# Patient Record
Sex: Male | Born: 1970 | Race: White | Hispanic: No | Marital: Married | Smoking: Never smoker
Health system: Southern US, Community
[De-identification: ages and names within clinical notes are randomized; demographics above are authoritative.]

## PROBLEM LIST (undated history)

## (undated) DIAGNOSIS — F32A Depression, unspecified: Secondary | ICD-10-CM

## (undated) DIAGNOSIS — N200 Calculus of kidney: Secondary | ICD-10-CM

## (undated) DIAGNOSIS — F419 Anxiety disorder, unspecified: Secondary | ICD-10-CM

## (undated) DIAGNOSIS — G4700 Insomnia, unspecified: Secondary | ICD-10-CM

## (undated) DIAGNOSIS — I10 Essential (primary) hypertension: Secondary | ICD-10-CM

## (undated) DIAGNOSIS — F329 Major depressive disorder, single episode, unspecified: Secondary | ICD-10-CM

## (undated) HISTORY — PX: CERVICAL FUSION: SHX112

## (undated) HISTORY — PX: ROTATOR CUFF REPAIR: SHX139

## (undated) HISTORY — PX: BARIATRIC SURGERY: SHX1103

## (undated) HISTORY — PX: KNEE SURGERY: SHX244

---
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
## 2011-04-10 ENCOUNTER — Encounter: Payer: Self-pay | Admitting: *Deleted

## 2011-04-10 ENCOUNTER — Emergency Department (INDEPENDENT_AMBULATORY_CARE_PROVIDER_SITE_OTHER): Payer: BC Managed Care – PPO

## 2011-04-10 ENCOUNTER — Emergency Department (INDEPENDENT_AMBULATORY_CARE_PROVIDER_SITE_OTHER)
Admission: EM | Admit: 2011-04-10 | Discharge: 2011-04-10 | Disposition: A | Discharge status: Home / Self Care | Payer: BC Managed Care – PPO | Source: Home / Self Care

## 2011-04-10 DIAGNOSIS — S96919A Strain of unspecified muscle and tendon at ankle and foot level, unspecified foot, initial encounter: Secondary | ICD-10-CM

## 2011-04-10 DIAGNOSIS — S93409A Sprain of unspecified ligament of unspecified ankle, initial encounter: Secondary | ICD-10-CM

## 2011-04-10 HISTORY — DX: Essential (primary) hypertension: I10

## 2011-04-10 HISTORY — DX: Calculus of kidney: N20.0

## 2011-04-10 MED ORDER — IBUPROFEN 800 MG PO TABS: 800.0000 mg | ORAL_TABLET | Freq: Three times a day (TID) | ORAL | Status: AC

## 2011-04-10 NOTE — ED Notes (Signed)
pT  REPORTS  HE  TURNED  HIS  L  ANKLE  SEV  HOURS  AGO  WHILE  AMBULATING  HE  REPORTS  PAIN ./ SWELLING  EVIDENT   NO  OBVIOUS  DEFORMITY  NOTED    HE  IS  ABLE  TO  BEAR  WEIGHT  BUT HAS  PAIN

## 2011-04-10 NOTE — ED Provider Notes (Signed)
History     CSN: [id number]  Arrival date & time 04/10/11  1722   None     Chief Complaint  Patient presents with  . Ankle Injury    (Consider location/radiation/quality/duration/timing/severity/associated sxs/prior treatment) HPI Comments: Pt states he caught his foot on the kitchen table leg at dinner this evening and rolled in ankle inward. He c/o medial ankle pain. Feels better "when my foot is flat on the floor" and hurts more "when I pick it up." He has been applying ice but has not taken anything for discomfort.   The history is provided by the patient.    Past Medical History  Diagnosis Date  . Hypertension   . Kidney stones     Past Surgical History  Procedure Date  . Knee surgery   . Cervical fusion     History reviewed. No pertinent family history.  History  Substance Use Topics  . Smoking status: Not on file  . Smokeless tobacco: Not on file  . Alcohol Use:       Review of Systems  Cardiovascular: Negative for leg swelling.  Musculoskeletal: Negative for joint swelling.  Skin: Negative for color change, rash and wound.    Allergies  Review of patient's allergies indicates no known allergies.  Home Medications   Current Outpatient Rx  Name Route Sig Dispense Refill  . ALPRAZOLAM 0.5 MG PO TABS Oral Take 0.5 mg by mouth at bedtime as needed.      . DULOXETINE HCL 30 MG PO CPEP Oral Take 30 mg by mouth daily.      Marland Kitchen LISINOPRIL 10 MG PO TABS Oral Take 10 mg by mouth daily.      Marland Kitchen ZOLPIDEM TARTRATE 10 MG PO TABS Oral Take 10 mg by mouth at bedtime as needed.      . IBUPROFEN 800 MG PO TABS Oral Take 1 tablet (800 mg total) by mouth 3 (three) times daily. 15 tablet 0    BP 155/109  Pulse 110  Temp(Src) 98.2 F (36.8 C) (Oral)  Resp 23  SpO2 97%  Physical Exam  Nursing note and vitals reviewed. Constitutional: He appears well-developed and well-nourished. No distress.  HENT:  Head: Normocephalic and atraumatic.  Musculoskeletal:   Left ankle: He exhibits normal range of motion, no swelling, no ecchymosis, no deformity, no laceration and normal pulse. tenderness (soft tissue tenderness posterior to distal tibia). No lateral malleolus, no medial malleolus, no AITFL, no CF ligament, no posterior TFL, no head of 5th metatarsal and no proximal fibula tenderness found. Achilles tendon normal.       Feet:  Neurological: He is alert. He has normal strength. No sensory deficit. Abnormal gait: limping.  Skin: Skin is warm and dry. No erythema.  Psychiatric: He has a normal mood and affect.    ED Course  Procedures (including critical care time)  Labs Reviewed - No data to display Dg Ankle Complete Left  04/10/2011  *RADIOLOGY REPORT*  Clinical Data: Inversion injury of the ankle.  Lateral and posterior pain.  LEFT ANKLE COMPLETE - 3+ VIEW  Comparison: None.  Findings: The mortise appears intact.  No malleolar fracture is observed.  However, there is equivocal periosteal reaction along the distal fibular metaphysis, and if there is a high index of suspicion of occult injury based on point tenderness, CT or MRI can be utilized for further characterization.  The talar dome appears unremarkable.  No fracture of the base of the fifth metatarsal is observed.  No calcaneal  fracture is noted.  IMPRESSION:  1.  No fracture is observed.  There is faint periosteal reaction along the distal fibular metaphysis, and accordingly if there is a high clinical index of suspicion of a fracture of the distal fibular metaphysis based on point tenderness then CT or MRI might be utilized to exclude occult fracture.  Original Report Authenticated By: Dellia Cloud, M.D.     1. Ankle strain       MDM   Medial Lt ankle pain. Ankle xray reviewed. Nontender distal fibula on exam and no c/o lateral ankle pain.        Melody Comas, [place] 04/10/11 2043

## 2011-04-14 NOTE — ED Provider Notes (Signed)
Medical screening examination/treatment/procedure(s) were performed by non-physician practitioner and as supervising physician I was immediately available for consultation/collaboration.  Luiz Blare MD   Luiz Blare, MD 04/14/11 [phone]

## 2019-08-06 ENCOUNTER — Other Ambulatory Visit: Payer: Self-pay

## 2019-08-06 ENCOUNTER — Encounter (HOSPITAL_COMMUNITY): Payer: Self-pay | Admitting: Emergency Medicine

## 2019-08-06 DIAGNOSIS — I10 Essential (primary) hypertension: Secondary | ICD-10-CM | POA: Insufficient documentation

## 2019-08-06 DIAGNOSIS — R110 Nausea: Secondary | ICD-10-CM | POA: Diagnosis not present

## 2019-08-06 DIAGNOSIS — R1031 Right lower quadrant pain: Secondary | ICD-10-CM | POA: Diagnosis present

## 2019-08-06 DIAGNOSIS — Z79899 Other long term (current) drug therapy: Secondary | ICD-10-CM | POA: Diagnosis not present

## 2019-08-06 DIAGNOSIS — E860 Dehydration: Secondary | ICD-10-CM | POA: Diagnosis not present

## 2019-08-06 LAB — URINALYSIS, ROUTINE W REFLEX MICROSCOPIC
Bilirubin Urine: NEGATIVE
Glucose, UA: NEGATIVE mg/dL
Hgb urine dipstick: NEGATIVE
Ketones, ur: NEGATIVE mg/dL
Leukocytes,Ua: NEGATIVE
Nitrite: NEGATIVE
Protein, ur: NEGATIVE mg/dL
Specific Gravity, Urine: 1.016 (ref 1.005–1.030)
pH: 6 (ref 5.0–8.0)

## 2019-08-06 LAB — COMPREHENSIVE METABOLIC PANEL
ALT: 41 U/L (ref 0–44)
AST: 19 U/L (ref 15–41)
Albumin: 4.1 g/dL (ref 3.5–5.0)
Alkaline Phosphatase: 116 U/L (ref 38–126)
Anion gap: 7 (ref 5–15)
BUN: 15 mg/dL (ref 6–20)
CO2: 17 mmol/L — ABNORMAL LOW (ref 22–32)
Calcium: 8.1 mg/dL — ABNORMAL LOW (ref 8.9–10.3)
Chloride: 115 mmol/L — ABNORMAL HIGH (ref 98–111)
Creatinine, Ser: 0.84 mg/dL (ref 0.61–1.24)
GFR calc Af Amer: >60 mL/min (ref >60)
GFR calc non Af Amer: >60 mL/min (ref >60)
Glucose, Bld: 99 mg/dL (ref 70–99)
Potassium: 3.5 mmol/L (ref 3.5–5.1)
Sodium: 139 mmol/L (ref 135–145)
Total Bilirubin: 0.4 mg/dL (ref 0.3–1.2)
Total Protein: 6.6 g/dL (ref 6.5–8.1)

## 2019-08-06 LAB — CBC
HCT: 41.8 % (ref 39.0–52.0)
Hemoglobin: 13.5 g/dL (ref 13.0–17.0)
MCH: 30.6 pg (ref 26.0–34.0)
MCHC: 32.3 g/dL (ref 30.0–36.0)
MCV: 94.8 fL (ref 80.0–100.0)
Platelets: 161 10*3/uL (ref 150–400)
RBC: 4.41 MIL/uL (ref 4.22–5.81)
RDW: 13.7 % (ref 11.5–15.5)
WBC: 5.3 10*3/uL (ref 4.0–10.5)
nRBC: 0 % (ref 0.0–0.2)

## 2019-08-06 LAB — LIPASE, BLOOD: Lipase: 31 U/L (ref 11–51)

## 2019-08-06 NOTE — ED Triage Notes (Signed)
Pt c/o RLQ abd pain that started today at 0700. States it has gotten progressively worse sine this AM.

## 2019-08-07 ENCOUNTER — Emergency Department (HOSPITAL_COMMUNITY): Payer: BC Managed Care – PPO

## 2019-08-07 ENCOUNTER — Emergency Department (HOSPITAL_COMMUNITY)
Admission: EM | Admit: 2019-08-07 | Discharge: 2019-08-07 | Disposition: A | Discharge status: Home / Self Care | Payer: BC Managed Care – PPO | Attending: Emergency Medicine | Admitting: Emergency Medicine

## 2019-08-07 DIAGNOSIS — R1031 Right lower quadrant pain: Secondary | ICD-10-CM

## 2019-08-07 DIAGNOSIS — E860 Dehydration: Secondary | ICD-10-CM

## 2019-08-07 HISTORY — DX: Anxiety disorder, unspecified: F41.9

## 2019-08-07 HISTORY — DX: Insomnia, unspecified: G47.00

## 2019-08-07 HISTORY — DX: Depression, unspecified: F32.A

## 2019-08-07 MED ORDER — IOHEXOL 300 MG/ML  SOLN
100.0000 mL | Freq: Once | INTRAMUSCULAR | Status: AC | PRN
  Administered 2019-08-07: 02:00:00 100 mL via INTRAVENOUS

## 2019-08-07 MED ORDER — FENTANYL CITRATE (PF) 100 MCG/2ML IJ SOLN
100.0000 ug | Freq: Once | INTRAMUSCULAR | Status: AC
  Administered 2019-08-07: 01:00:00 100 ug via INTRAVENOUS
  Filled 2019-08-07: qty 2

## 2019-08-07 MED ORDER — SODIUM CHLORIDE 0.9 % IV BOLUS (SEPSIS)
2000.0000 mL | Freq: Once | INTRAVENOUS | Status: AC
  Administered 2019-08-07: 2000 mL via INTRAVENOUS

## 2019-08-07 MED ORDER — ONDANSETRON HCL 4 MG/2ML IJ SOLN
4.0000 mg | Freq: Once | INTRAMUSCULAR | Status: AC
  Administered 2019-08-07: 4 mg via INTRAVENOUS
  Filled 2019-08-07: qty 2

## 2019-08-07 NOTE — Discharge Instructions (Addendum)
°  SEEK IMMEDIATE MEDICAL ATTENTION IF: °The pain does not go away or becomes severe, particularly over the next 8-12 hours.  °A temperature above 100.4F develops.  °Repeated vomiting occurs (multiple episodes).  °The pain becomes localized to portions of the abdomen.  In an adult, the left lower portion of the abdomen could be colitis or diverticulitis.  °Blood is being passed in stools or vomit (bright red or black tarry stools).  °Return also if you develop chest pain, difficulty breathing, dizziness or fainting, or become confused, poorly responsive, or inconsolable. ° °

## 2019-08-07 NOTE — ED Provider Notes (Signed)
[facility] EMERGENCY DEPARTMENT Provider Note   CSN: [id number] Arrival date & time: 08/06/19  2106     History Chief Complaint  Patient presents with  . Abdominal Pain    Roberto Lawson is a 49 y.o. male.  The history is provided by the patient.  Abdominal Pain Pain location:  RLQ Pain quality: aching   Pain radiates to:  Does not radiate Pain severity:  Moderate Onset quality:  Gradual Duration:  18 hours Timing:  Constant Progression:  Worsening Chronicity:  New Relieved by:  Nothing Worsened by:  Movement and palpation Associated symptoms: nausea   Associated symptoms: no chest pain, no diarrhea, no dysuria, no fever, no shortness of breath and no vomiting    Patient reports he woke up with right lower quadrant abdominal pain that has been worsening throughout the day.  It does not radiate.  Has never had this before Previous history of bariatric surgery as well as cholecystectomy    Past Medical History:  Diagnosis Date  . Anxiety   . Depression   . Hypertension   . Insomnia   . Kidney stones     There are no problems to display for this patient.   Past Surgical History:  Procedure Laterality Date  . BARIATRIC SURGERY    . CERVICAL FUSION    . KNEE SURGERY    . ROTATOR CUFF REPAIR Right        No family history on file.  Social History   Tobacco Use  . Smoking status: Never Smoker  . Smokeless tobacco: Never Used  Substance Use Topics  . Alcohol use: Not Currently  . Drug use: Not Currently    Home Medications Prior to Admission medications   Medication Sig Start Date End Date Taking? Authorizing Provider  ALPRAZolam Prudy Feeler) 0.5 MG tablet Take 0.5 mg by mouth at bedtime as needed.      [provider]  DULoxetine (CYMBALTA) 30 MG capsule Take 30 mg by mouth daily.      [provider]  lisinopril (PRINIVIL,ZESTRIL) 10 MG tablet Take 10 mg by mouth daily.      [provider]  zolpidem (AMBIEN) 10 MG tablet  Take 10 mg by mouth at bedtime as needed.      [provider]    Allergies    Indomethacin, Statins, and Wellbutrin [bupropion]  Review of Systems   Review of Systems  Constitutional: Negative for fever.  Respiratory: Negative for shortness of breath.   Cardiovascular: Negative for chest pain.  Gastrointestinal: Positive for abdominal pain and nausea. Negative for diarrhea and vomiting.  Genitourinary: Negative for dysuria and testicular pain.  All other systems reviewed and are negative.   Physical Exam Updated Vital Signs BP (!) 147/92 (BP Location: Right Arm)   Pulse 73   Temp 98.2 F (36.8 C) (Oral)   Resp 20   Ht 1.88 m (6\' 2" )   Wt 104.3 kg   SpO2 100%   BMI 29.53 kg/m   Physical Exam CONSTITUTIONAL: Well developed/well nourished, uncomfortable appearing HEAD: Normocephalic/atraumatic EYES: EOMI/PERRL ENMT: Mucous membranes moist NECK: supple no meningeal signs SPINE/BACK:entire spine nontender CV: S1/S2 noted, no murmurs/rubs/gallops noted LUNGS: Lungs are clear to auscultation bilaterally, no apparent distress ABDOMEN: soft, moderate RLQ tenderness, no rebound or guarding, bowel sounds noted throughout abdomen GU:no cva tenderness NEURO: Pt is awake/alert/appropriate, moves all extremitiesx4.  No facial droop.  EXTREMITIES: pulses normal/equal, full ROM SKIN: warm, color normal PSYCH: no abnormalities of mood noted, alert  and oriented to situation  ED Results / Procedures / Treatments   Labs (all labs ordered are listed, but only abnormal results are displayed) Labs Reviewed  COMPREHENSIVE METABOLIC PANEL - Abnormal; Notable for the following components:      Result Value   Chloride 115 (*)    CO2 17 (*)    Calcium 8.1 (*)    All other components within normal limits  LIPASE, BLOOD  CBC  URINALYSIS, ROUTINE W REFLEX MICROSCOPIC    EKG None  Radiology CT ABDOMEN PELVIS W CONTRAST  Result Date: 08/07/2019 CLINICAL DATA:  Right lower  quadrant abdominal pain EXAM: CT ABDOMEN AND PELVIS WITH CONTRAST TECHNIQUE: Multidetector CT imaging of the abdomen and pelvis was performed using the standard protocol following bolus administration of intravenous contrast. CONTRAST:  OMNIPAQUE IOHEXOL 300 MG/ML  SOLN COMPARISON:  None. FINDINGS: Lower chest: The visualized heart size within normal limits. No pericardial fluid/thickening. No hiatal hernia. The visualized portions of the lungs are clear. Hepatobiliary: The liver is normal in density without focal abnormality.The main portal vein is patent. The patient is status post cholecystectomy. No biliary ductal dilation. Pancreas: Unremarkable. No pancreatic ductal dilatation or surrounding inflammatory changes. Spleen: Normal in size without focal abnormality. Adrenals/Urinary Tract: Both adrenal glands appear normal. There is a punctate calculus seen in the lower pole the right kidney. No hydronephrosis is seen. Bladder is unremarkable. Stomach/Bowel: The patient is status post sleeve gastrectomy. The small bowel and colon are normal in appearance. Surgical anastomosis seen within the right lower quadrant. The appendix is unremarkable. No focal bowel wall thickening or mesenteric fat stranding changes are seen. There is a moderate amount of colonic stool present. Vascular/Lymphatic: There is mild fat stranding changes seen within the mid mesentery with scattered small lymph nodes present. No significant vascular findings are present. Reproductive: The prostate is unremarkable. Other: Small left inguinal fat containing hernia is present. Musculoskeletal: No acute or significant osseous findings. 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 IMPRESSION: Nonspecific mild mid mesenteric fat stranding with scattered small lymph nodes which may be due to mild mesenteric panniculitis. Punctate nonobstructing right renal calculi. Electronically Signed    By: Jonna Clark M.D.   On: 08/07/2019 02:05    Procedures Procedures    Medications Ordered in ED Medications  sodium chloride 0.9 % bolus 2,000 mL (0 mLs Intravenous Stopped 08/07/19 0314)  fentaNYL (SUBLIMAZE) injection 100 mcg (100 mcg Intravenous Given 08/07/19 0127)  ondansetron (ZOFRAN) injection 4 mg (4 mg Intravenous Given 08/07/19 0127)  iohexol (OMNIPAQUE) 300 MG/ML solution 100 mL (100 mLs Intravenous Contrast Given 08/07/19 0137)    ED Course  I have reviewed the triage vital signs and the nursing notes.  Pertinent labs & imaging results that were available during my care of the patient were reviewed by me and considered in my medical decision making (see chart for details).    MDM Rules/Calculators/A&P                       This patient presents to the ED for concern of abdominal pain, this involves an extensive number of treatment options, and is a complaint that carries with it a high risk of complications and morbidity.  The differential diagnosis includes appendicitis, UTI, diverticulitis, bowel perforation   Lab Tests:   I Ordered, reviewed, and interpreted labs, which included urinalysis, electrolyte panel, complete blood count  Medicines  ordered:   I ordered medication fentanyl and Zofran for nausea and pain, IV fluids for dehydration  Imaging Studies ordered:   I ordered imaging studies which included CT abdomen pelvis   I independently visualized and interpreted imaging which showed mesenteric fat stranding, no appendicitis  Reevaluation:  After the interventions stated above, I reevaluated the patient and found patient is improved   Patient presented right lower quadrant abdominal pain.  No leukocytosis, no fever.  He was dehydrated and given IV fluids.  CT imaging reveals mesenteric fat stranding, but no acute abdominal emergency.  Patient feels improved.  Will discharge home. Final Clinical Impression(s) / ED Diagnoses Final diagnoses:  Right  lower quadrant abdominal pain  Dehydration    Rx / DC Orders ED Discharge Orders    None       Ripley Fraise, MD 08/07/19 [phone]

## 2020-02-23 ENCOUNTER — Other Ambulatory Visit: Payer: Self-pay

## 2020-02-23 ENCOUNTER — Encounter: Payer: Self-pay | Admitting: Orthopedic Surgery

## 2020-02-23 ENCOUNTER — Ambulatory Visit: Payer: BC Managed Care – PPO | Admitting: Orthopedic Surgery

## 2020-02-23 DIAGNOSIS — M25871 Other specified joint disorders, right ankle and foot: Secondary | ICD-10-CM

## 2020-02-23 DIAGNOSIS — M1A0710 Idiopathic chronic gout, right ankle and foot, without tophus (tophi): Secondary | ICD-10-CM | POA: Diagnosis not present

## 2020-02-23 DIAGNOSIS — M7671 Peroneal tendinitis, right leg: Secondary | ICD-10-CM | POA: Diagnosis not present

## 2020-02-23 NOTE — Progress Notes (Signed)
Office Visit Note   Patient: Roberto Lawson           Date of Birth: September 16, 1970           MRN: [id number] Visit Date: 02/23/2020              Requested by: Zenaida Niece, MD No address on file PCP: Zenaida Niece, MD  Chief Complaint  Patient presents with  . Right Ankle - Pain      HPI: Patient is a 49 year old gentleman who was seen for initial evaluation for right ankle pain.  He denies any specific injuries he feels like he may roll his ankle at work stepping on and off a 2 inch metal plate he states he has had symptoms for 2-1/2 months he has been treated in [place].  He has used an ankle stabilizing orthosis most recently has had an MRI scan which shows edema within the peroneal tendons.  Patient states he has a history of gout he is not on medication and states he has had no flareup.  He complains of pain anteriorly over the joint line as well as pain posterior medially and posterior laterally.  Assessment & Plan: Visit Diagnoses:  1. Idiopathic chronic gout of right ankle without tophus   2. Impingement of right ankle joint   3. Peroneal tendinitis, right leg     Plan: The ankle was injected from the anterior medial portal he tolerated this well we will obtain a uric acid in follow-up.  Discussed that we will see where his symptoms are primarily coming from at follow-up.  Follow-Up Instructions: Return in about 3 weeks (around 03/15/2020).   Ortho Exam  Patient is alert, oriented, no adenopathy, well-dressed, normal affect, normal respiratory effort. Examination patient does have venous swelling is difficult to palpate a pulse but he does have good capillary refill.  There is no redness no cellulitis he is primarily tender to palpation anteriorly over the joint line.  The peroneal and posterior tibial tendon are minimally tender to palpation he does have good ankle and subtalar motion but no increased laxity with varus or valgus stress this does not reproduce  symptoms.  Review the MRI scan does show some edema within the peroneal tendons but also shows some edema in the tibial talar joint as well as bony spurs anteriorly and posteriorly to the tibiotalar joint.  Do not appreciate any cystic changes.  Imaging: No results found. No images are attached to the encounter.  Labs: No results found for: HGBA1C, ESRSEDRATE, CRP, LABURIC, REPTSTATUS, GRAMSTAIN, CULT, LABORGA   Lab Results  Component Value Date   ALBUMIN 4.1 08/06/2019    No results found for: MG No results found for: VD25OH  No results found for: PREALBUMIN CBC EXTENDED Latest Ref Rng & Units 08/06/2019  WBC 4.0 - 10.5 K/uL 5.3  RBC 4.22 - 5.81 MIL/uL 4.41  HGB 13.0 - 17.0 g/dL 78.2  HCT 39 - 52 % 95.6  PLT 150 - 400 K/uL 161     There is no height or weight on file to calculate BMI.  Orders:  No orders of the defined types were placed in this encounter.  No orders of the defined types were placed in this encounter.    Procedures: No procedures performed  Clinical Data: No additional findings.  ROS:  All other systems negative, except as noted in the HPI. Review of Systems  Objective: Vital Signs: There were no vitals taken for this visit.  Specialty Comments:  No specialty comments available.  PMFS History: There are no problems to display for this patient.  Past Medical History:  Diagnosis Date  . Anxiety   . Depression   . Hypertension   . Insomnia   . Kidney stones     History reviewed. No pertinent family history.  Past Surgical History:  Procedure Laterality Date  . BARIATRIC SURGERY    . CERVICAL FUSION    . KNEE SURGERY    . ROTATOR CUFF REPAIR Right    Social History   Occupational History  . Not on file  Tobacco Use  . Smoking status: Never Smoker  . Smokeless tobacco: Never Used  Substance and Sexual Activity  . Alcohol use: Not Currently  . Drug use: Not Currently  . Sexual activity: Not on file

## 2020-02-24 LAB — URIC ACID: Uric Acid, Serum: 6.4 mg/dL (ref 4.0–8.0)

## 2020-03-17 ENCOUNTER — Ambulatory Visit: Payer: Self-pay

## 2020-03-17 ENCOUNTER — Ambulatory Visit (INDEPENDENT_AMBULATORY_CARE_PROVIDER_SITE_OTHER): Payer: BC Managed Care – PPO | Admitting: Orthopedic Surgery

## 2020-03-17 ENCOUNTER — Encounter: Payer: Self-pay | Admitting: Orthopedic Surgery

## 2020-03-17 DIAGNOSIS — M79671 Pain in right foot: Secondary | ICD-10-CM

## 2020-03-17 NOTE — Progress Notes (Signed)
Office Visit Note   Patient: Roberto Lawson           Date of Birth: 13-Apr-1970           MRN: [id number] Visit Date: 03/17/2020              Requested by: Zenaida Niece, MD No address on file PCP: Zenaida Niece, MD  Chief Complaint  Patient presents with  . Right Ankle - Follow-up      HPI: Patient is a 49 year old gentleman who presents in follow-up for his right ankl  patient underwent a steroid injection for the right ankle about 3 weeks ago he states that this has provided him good pain relief of the ankle.  Patient states that he felt a sharp pain Sunday at the base of the fifth metatarsal right foot patient feels like he heard a pop and has had difficulty with weightbearing.  Most recent uric acid is 6.4. Assessment & Plan: Visit Diagnoses:  1. Pain in right foot     Plan: Patient is given a postoperative shoe he may wear his steel toed shoes for work discussed that this is most likely a stress fracture and should resolve uneventfully.  Repeat three-view radiographs of the right foot at follow-up.  Follow-Up Instructions: Return in about 4 weeks (around 04/14/2020).   Ortho Exam  Patient is alert, oriented, no adenopathy, well-dressed, normal affect, normal respiratory effort. Examination patient has a good pulse he does have venous stasis swelling recommended compression stockings he has no pain with ankle or subtalar motion he is point tender to palpation over the base of the fifth metatarsal there is no ecchymosis no bruising.  Clinically patient has a stress fracture through the base of the fifth metatarsal.  Imaging: XR Foot Complete Right  Result Date: 03/17/2020 2 view radiographs of the right foot shows no displaced fracture of the fifth metatarsal radiographs are normal  No images are attached to the encounter.  Labs: Lab Results  Component Value Date   LABURIC 6.4 02/23/2020     Lab Results  Component Value Date   ALBUMIN 4.1 08/06/2019   LABURIC  6.4 02/23/2020    No results found for: MG No results found for: VD25OH  No results found for: PREALBUMIN CBC EXTENDED Latest Ref Rng & Units 08/06/2019  WBC 4.0 - 10.5 K/uL 5.3  RBC 4.22 - 5.81 MIL/uL 4.41  HGB 13.0 - 17.0 g/dL 01.0  HCT 93.2 - 35.5 % 41.8  PLT 150 - 400 K/uL 161     There is no height or weight on file to calculate BMI.  Orders:  Orders Placed This Encounter  Procedures  . XR Foot Complete Right   No orders of the defined types were placed in this encounter.    Procedures: No procedures performed  Clinical Data: No additional findings.  ROS:  All other systems negative, except as noted in the HPI. Review of Systems  Objective: Vital Signs: There were no vitals taken for this visit.  Specialty Comments:  No specialty comments available.  PMFS History: There are no problems to display for this patient.  Past Medical History:  Diagnosis Date  . Anxiety   . Depression   . Hypertension   . Insomnia   . Kidney stones     History reviewed. No pertinent family history.  Past Surgical History:  Procedure Laterality Date  . BARIATRIC SURGERY    . CERVICAL FUSION    . KNEE SURGERY    .  ROTATOR CUFF REPAIR Right    Social History   Occupational History  . Not on file  Tobacco Use  . Smoking status: Never Smoker  . Smokeless tobacco: Never Used  Substance and Sexual Activity  . Alcohol use: Not Currently  . Drug use: Not Currently  . Sexual activity: Not on file

## 2020-04-19 ENCOUNTER — Encounter: Payer: Self-pay | Admitting: Orthopedic Surgery

## 2020-04-19 ENCOUNTER — Ambulatory Visit: Payer: Self-pay

## 2020-04-19 ENCOUNTER — Ambulatory Visit: Payer: BC Managed Care – PPO | Admitting: Physician Assistant

## 2020-04-19 DIAGNOSIS — M25571 Pain in right ankle and joints of right foot: Secondary | ICD-10-CM | POA: Diagnosis not present

## 2020-04-19 NOTE — Progress Notes (Signed)
   Office Visit Note   Patient: Roberto Lawson           Date of Birth: Apr 20, 1970           MRN: [id number] Visit Date: 04/19/2020              Requested by: Zenaida Niece, MD No address on file PCP: Zenaida Niece, MD  Chief Complaint  Patient presents with  . Right Foot - Pain      HPI: Patient presents in follow-up for his right foot and ankle.  He has a 28-month history of right lateral foot pain and right ankle pain.  He was injected into his ankle which she says helped a little bit.  He feels like he continues to roll his ankle.  Pain over the base of the 5th metatarsal has improved.  He is wearing a postop shoe and a brace  Assessment & Plan: Visit Diagnoses:  1. Pain in right ankle and joints of right foot     Plan: We will go forward with physical therapy for ankle stabilization program follow-up in 4 weeks  Follow-Up Instructions: No follow-ups on file.   Ortho Exam  Patient is alert, oriented, no adenopathy, well-dressed, normal affect, normal respiratory effort. Right ankle minimal soft tissue swelling he does have pain with resisted eversion palpable dorsalis pedis pulse pain over the medial ankle joint line.  Slightly increased anterior draw.  No pain with plantar flexion.  Compartments are soft and nontender  Imaging: No results found. No images are attached to the encounter.  Labs: Lab Results  Component Value Date   LABURIC 6.4 02/23/2020     Lab Results  Component Value Date   ALBUMIN 4.1 08/06/2019   LABURIC 6.4 02/23/2020    No results found for: MG No results found for: VD25OH  No results found for: PREALBUMIN CBC EXTENDED Latest Ref Rng & Units 08/06/2019  WBC 4.0 - 10.5 K/uL 5.3  RBC 4.22 - 5.81 MIL/uL 4.41  HGB 13.0 - 17.0 g/dL 74.0  HCT 81.4 - 48.1 % 41.8  PLT 150 - 400 K/uL 161     There is no height or weight on file to calculate BMI.  Orders:  Orders Placed This Encounter  Procedures  . XR Foot Complete Right   No orders  of the defined types were placed in this encounter.    Procedures: No procedures performed  Clinical Data: No additional findings.  ROS:  All other systems negative, except as noted in the HPI. Review of Systems  Objective: Vital Signs: There were no vitals taken for this visit.  Specialty Comments:  No specialty comments available.  PMFS History: There are no problems to display for this patient.  Past Medical History:  Diagnosis Date  . Anxiety   . Depression   . Hypertension   . Insomnia   . Kidney stones     No family history on file.  Past Surgical History:  Procedure Laterality Date  . BARIATRIC SURGERY    . CERVICAL FUSION    . KNEE SURGERY    . ROTATOR CUFF REPAIR Right    Social History   Occupational History  . Not on file  Tobacco Use  . Smoking status: Never Smoker  . Smokeless tobacco: Never Used  Substance and Sexual Activity  . Alcohol use: Not Currently  . Drug use: Not Currently  . Sexual activity: Not on file

## 2020-05-17 ENCOUNTER — Ambulatory Visit: Payer: BC Managed Care – PPO | Admitting: Orthopedic Surgery

## 2020-05-26 ENCOUNTER — Ambulatory Visit: Payer: BC Managed Care – PPO | Admitting: Orthopedic Surgery

## 2020-05-26 ENCOUNTER — Encounter: Payer: Self-pay | Admitting: Orthopedic Surgery

## 2020-05-26 DIAGNOSIS — M25571 Pain in right ankle and joints of right foot: Secondary | ICD-10-CM | POA: Diagnosis not present

## 2020-05-26 NOTE — Progress Notes (Signed)
   Office Visit Note   Patient: Roberto Lawson           Date of Birth: 20-Nov-1970           MRN: [id number] Visit Date: 05/26/2020              Requested by: Zenaida Niece, MD No address on file PCP: Zenaida Niece, MD  Chief Complaint  Patient presents with  . Right Foot - Follow-up      HPI: Patient is a 50 year old gentleman who presents with anterior right ankle pain as well as pain at the base of the fourth and fifth metatarsals right foot.  Patient has been going to physical therapy with minimal relief.  Assessment & Plan: Visit Diagnoses:  1. Pain in right ankle and joints of right foot     Plan: Recommended a stiff soled shoe or carbon insert to unload pressure across the midfoot.  Recommended Voltaren gel for the foot and ankle symptoms.  Follow-Up Instructions: Return if symptoms worsen or fail to improve.   Ortho Exam  Patient is alert, oriented, no adenopathy, well-dressed, normal affect, normal respiratory effort. Examination patient has good pulses his radiographs show some small spurs anteriorly at the tibial talar joint as well as some degenerative change of the base of the fourth and fifth metatarsals.  He has good dorsiflexion ankle of about 20 degrees he has pain to palpation anteriorly over the ankle as well as over the base of the fourth and fifth metatarsals.  Imaging: No results found. No images are attached to the encounter.  Labs: Lab Results  Component Value Date   LABURIC 6.4 02/23/2020     Lab Results  Component Value Date   ALBUMIN 4.1 08/06/2019   LABURIC 6.4 02/23/2020    No results found for: MG No results found for: VD25OH  No results found for: PREALBUMIN CBC EXTENDED Latest Ref Rng & Units 08/06/2019  WBC 4.0 - 10.5 K/uL 5.3  RBC 4.22 - 5.81 MIL/uL 4.41  HGB 13.0 - 17.0 g/dL 95.1  HCT 88.4 - 16.6 % 41.8  PLT 150 - 400 K/uL 161     There is no height or weight on file to calculate BMI.  Orders:  No orders of the defined  types were placed in this encounter.  No orders of the defined types were placed in this encounter.    Procedures: No procedures performed  Clinical Data: No additional findings.  ROS:  All other systems negative, except as noted in the HPI. Review of Systems  Objective: Vital Signs: There were no vitals taken for this visit.  Specialty Comments:  No specialty comments available.  PMFS History: There are no problems to display for this patient.  Past Medical History:  Diagnosis Date  . Anxiety   . Depression   . Hypertension   . Insomnia   . Kidney stones     History reviewed. No pertinent family history.  Past Surgical History:  Procedure Laterality Date  . BARIATRIC SURGERY    . CERVICAL FUSION    . KNEE SURGERY    . ROTATOR CUFF REPAIR Right    Social History   Occupational History  . Not on file  Tobacco Use  . Smoking status: Never Smoker  . Smokeless tobacco: Never Used  Substance and Sexual Activity  . Alcohol use: Not Currently  . Drug use: Not Currently  . Sexual activity: Not on file

## 2021-01-04 IMAGING — CT CT ABD-PELV W/ CM
2 of 4 series · 16 of 46 positions shown, 18 images · IV contrast (Omnipaque or Isovue)
Comparison: None.

CLINICAL DATA: Right lower quadrant abdominal pain

EXAM:
CT ABDOMEN AND PELVIS WITH CONTRAST
TECHNIQUE: Multidetector CT imaging of the abdomen and pelvis was performed
using the standard protocol following bolus administration of
intravenous contrast.
CONTRAST:  100mL OMNIPAQUE IOHEXOL 300 MG/ML  SOLN

[Series 2: axial st · axial · 0.98mm/px · z∈[-390,+60]mm · 13 of 100 slices shown, 15 images]
[im 5/100  soft-tissue]
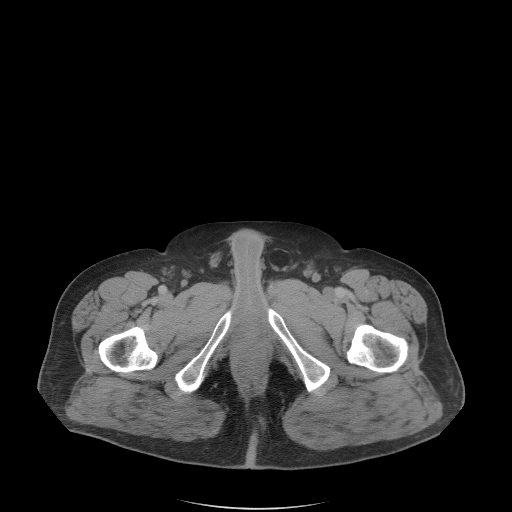
[im 5/100  bone]
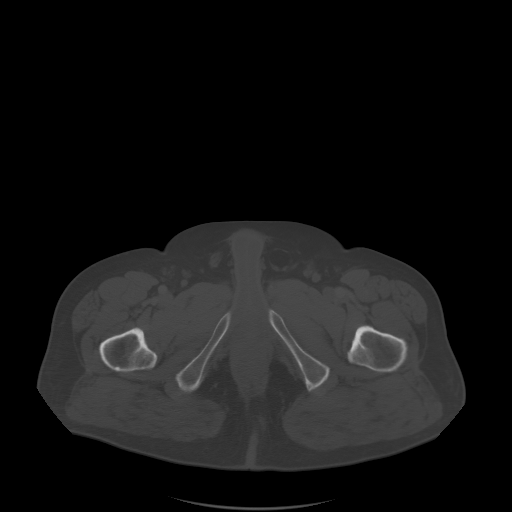
[im 14/100  soft-tissue]
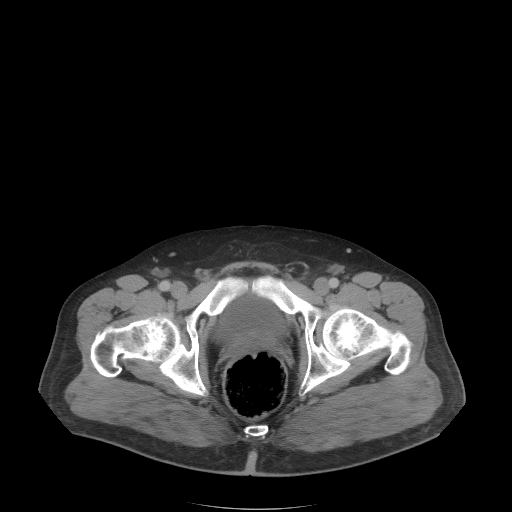
[im 23/100  soft-tissue]
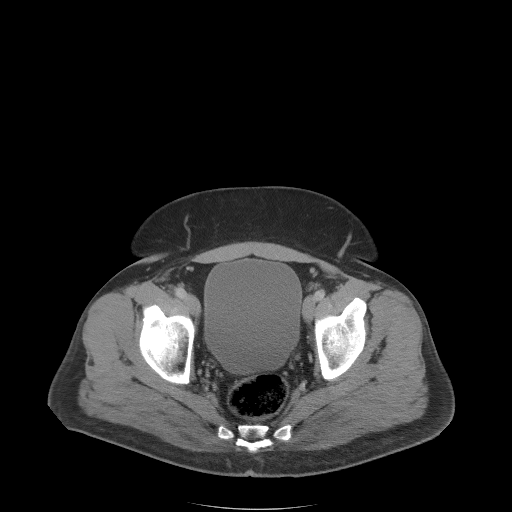
[im 28/100  soft-tissue]
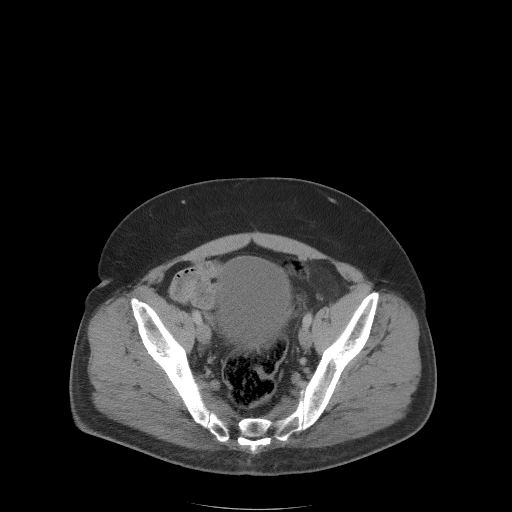
[im 37/100  soft-tissue]
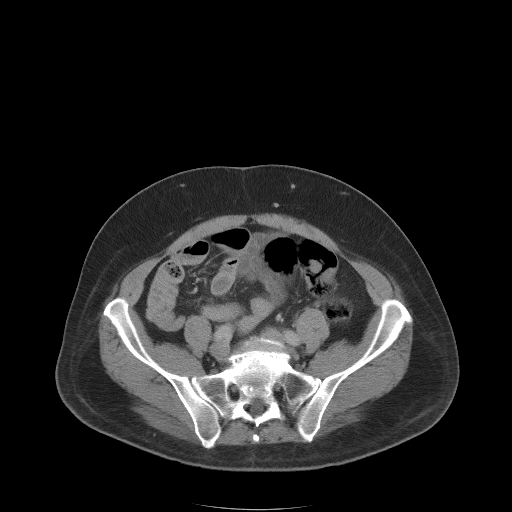
[im 41/100  soft-tissue]
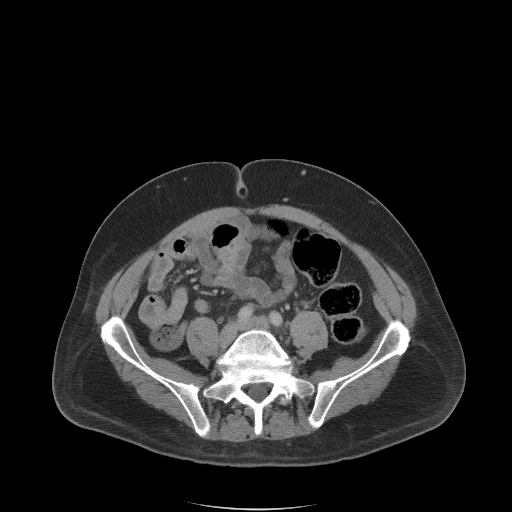
[im 50/100  soft-tissue]
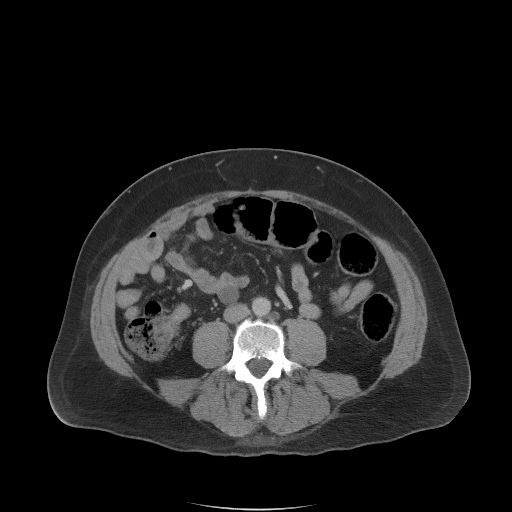
[im 59/100  soft-tissue]
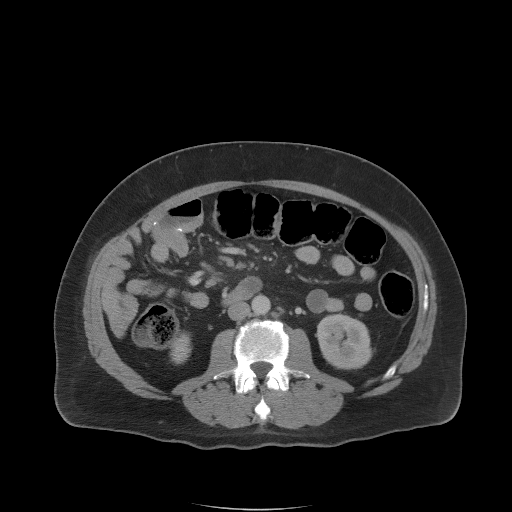
[im 64/100  soft-tissue]
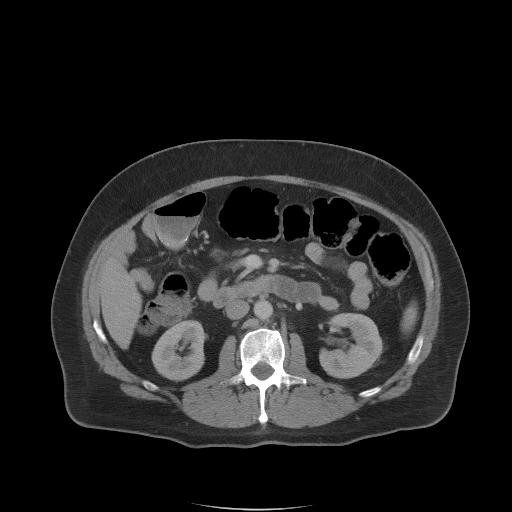
[im 64/100  bone]
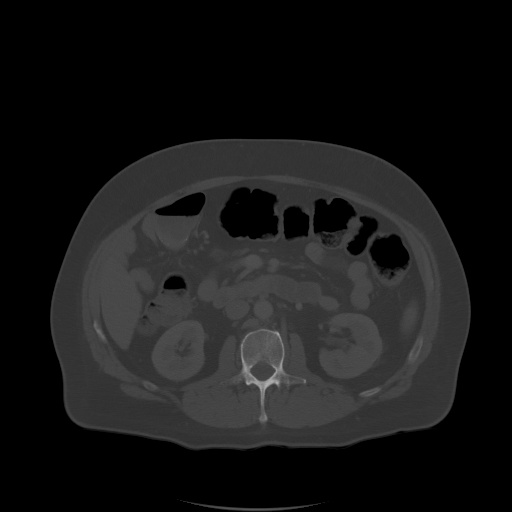
[im 73/100  soft-tissue]
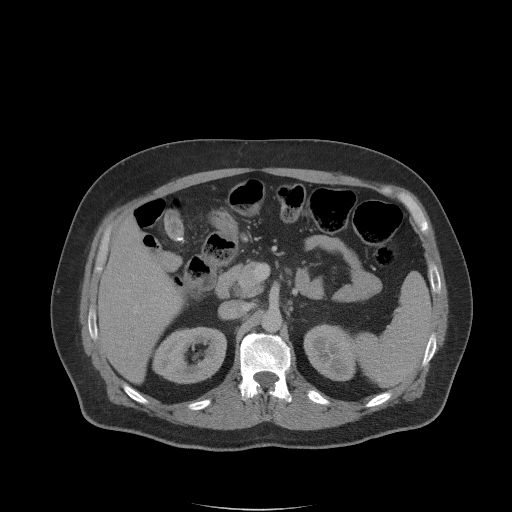
[im 77/100  soft-tissue]
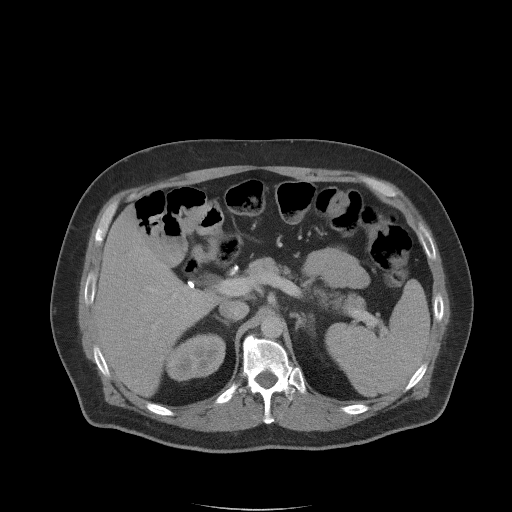
[im 86/100  soft-tissue]
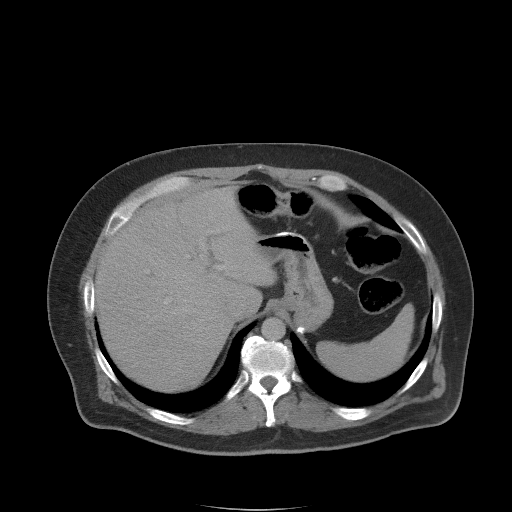
[im 95/100  soft-tissue]
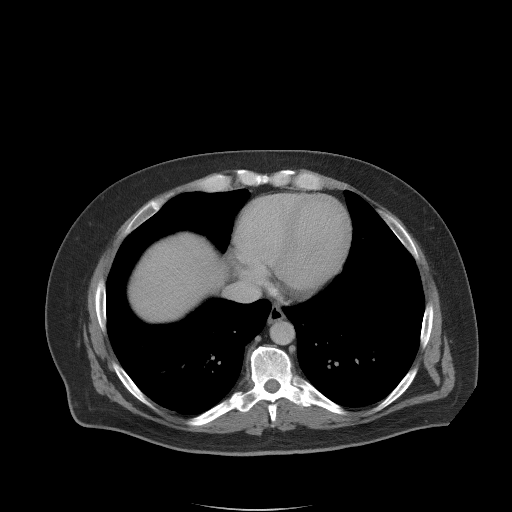

[Series 5: coronal st · coronal · 0.87mm/px · 3 of 107 slices shown]
[im 36/107  soft-tissue]
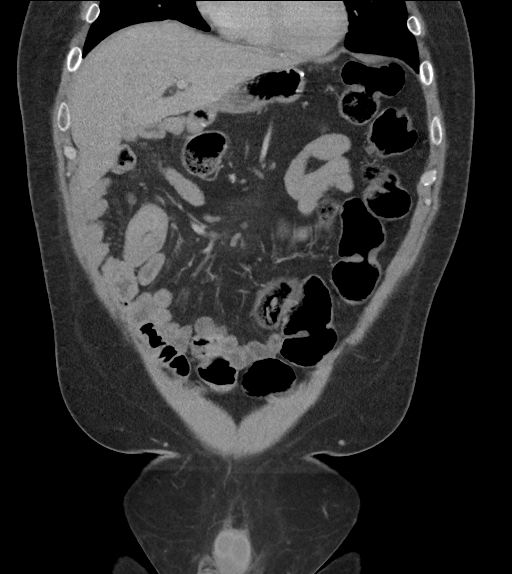
[im 48/107  soft-tissue]
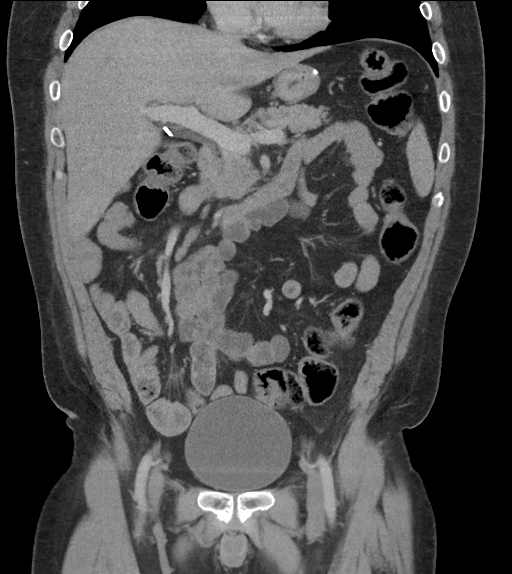
[im 59/107  soft-tissue]
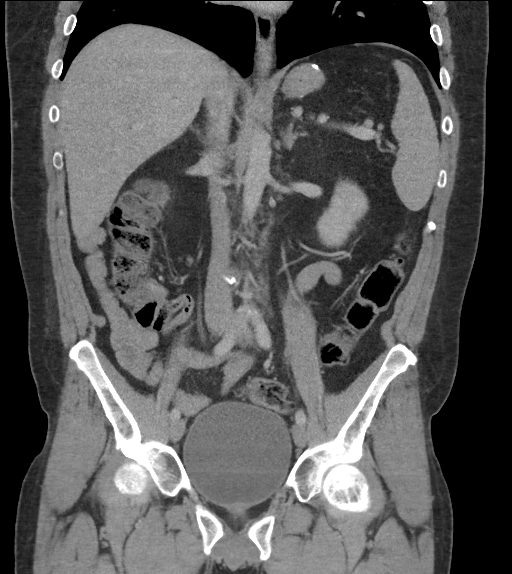

[16 of 46 positions shown; findings below may reference images not displayed]

FINDINGS: Lower chest: The visualized heart size within normal limits. No
pericardial fluid/thickening.

No hiatal hernia.

The visualized portions of the lungs are clear.

Hepatobiliary: The liver is normal in density without focal
abnormality.The main portal vein is patent. The patient is status
post cholecystectomy. No biliary ductal dilation.

Pancreas: Unremarkable. No pancreatic ductal dilatation or
surrounding inflammatory changes.

Spleen: Normal in size without focal abnormality.

Adrenals/Urinary Tract: Both adrenal glands appear normal. There is
a punctate calculus seen in the lower pole the right kidney. No
hydronephrosis is seen. Bladder is unremarkable.

Stomach/Bowel: The patient is status post sleeve gastrectomy. The
small bowel and colon are normal in appearance. Surgical anastomosis
seen within the right lower quadrant. The appendix is unremarkable.
No focal bowel wall thickening or mesenteric fat stranding changes
are seen. There is a moderate amount of colonic stool present.

Vascular/Lymphatic: There is mild fat stranding changes seen within
the mid mesentery with scattered small lymph nodes present. No
significant vascular findings are present.

Reproductive: The prostate is unremarkable.

Other: Small left inguinal fat containing hernia is present.

Musculoskeletal: No acute or significant osseous findings. A grade 1
anterolisthesis of L5 on S1 measuring 7 mm is present due to chronic
bilateral pars defects.
IMPRESSION: Nonspecific mild mid mesenteric fat stranding with scattered small
lymph nodes which may be due to mild mesenteric panniculitis.

Punctate nonobstructing right renal calculi.
